# Patient Record
Sex: Male | Born: 1950 | Race: White | Hispanic: No | Marital: Married | Smoking: Never smoker
Health system: Southern US, Community
[De-identification: ages and names within clinical notes are randomized; demographics above are authoritative.]

---

## 1998-01-20 ENCOUNTER — Ambulatory Visit (HOSPITAL_COMMUNITY): Admission: RE | Admit: 1998-01-20 | Discharge: 1998-01-20 | Payer: Self-pay | Admitting: Gastroenterology

## 2004-11-07 ENCOUNTER — Encounter: Admission: RE | Admit: 2004-11-07 | Discharge: 2004-11-07 | Payer: Self-pay | Admitting: Family Medicine

## 2005-04-03 ENCOUNTER — Ambulatory Visit (HOSPITAL_COMMUNITY): Admission: RE | Admit: 2005-04-03 | Discharge: 2005-04-03 | Payer: Self-pay | Admitting: Gastroenterology

## 2010-04-17 ENCOUNTER — Other Ambulatory Visit: Payer: Self-pay | Admitting: Orthopedic Surgery

## 2010-04-17 DIAGNOSIS — M545 Low back pain, unspecified: Secondary | ICD-10-CM

## 2010-04-25 ENCOUNTER — Ambulatory Visit
Admission: RE | Admit: 2010-04-25 | Discharge: 2010-04-25 | Disposition: A | Discharge status: Home / Self Care | Payer: BC Managed Care – PPO | Source: Ambulatory Visit | Attending: Orthopedic Surgery | Admitting: Orthopedic Surgery

## 2010-04-25 DIAGNOSIS — M545 Low back pain: Secondary | ICD-10-CM

## 2014-01-19 DIAGNOSIS — Z23 Encounter for immunization: Secondary | ICD-10-CM | POA: Diagnosis not present

## 2014-06-24 DIAGNOSIS — Z6825 Body mass index (BMI) 25.0-25.9, adult: Secondary | ICD-10-CM | POA: Diagnosis not present

## 2014-06-24 DIAGNOSIS — Z0000 Encounter for general adult medical examination without abnormal findings: Secondary | ICD-10-CM | POA: Diagnosis not present

## 2014-06-24 DIAGNOSIS — Z125 Encounter for screening for malignant neoplasm of prostate: Secondary | ICD-10-CM | POA: Diagnosis not present

## 2014-06-24 DIAGNOSIS — Z23 Encounter for immunization: Secondary | ICD-10-CM | POA: Diagnosis not present

## 2014-07-21 DIAGNOSIS — M25511 Pain in right shoulder: Secondary | ICD-10-CM | POA: Diagnosis not present

## 2014-08-04 DIAGNOSIS — M25511 Pain in right shoulder: Secondary | ICD-10-CM | POA: Diagnosis not present

## 2014-08-23 DIAGNOSIS — B079 Viral wart, unspecified: Secondary | ICD-10-CM | POA: Diagnosis not present

## 2014-08-23 DIAGNOSIS — L578 Other skin changes due to chronic exposure to nonionizing radiation: Secondary | ICD-10-CM | POA: Diagnosis not present

## 2014-08-24 DIAGNOSIS — M25511 Pain in right shoulder: Secondary | ICD-10-CM | POA: Diagnosis not present

## 2014-08-29 DIAGNOSIS — M25511 Pain in right shoulder: Secondary | ICD-10-CM | POA: Diagnosis not present

## 2014-08-31 DIAGNOSIS — H43813 Vitreous degeneration, bilateral: Secondary | ICD-10-CM | POA: Diagnosis not present

## 2014-08-31 DIAGNOSIS — H52223 Regular astigmatism, bilateral: Secondary | ICD-10-CM | POA: Diagnosis not present

## 2014-08-31 DIAGNOSIS — H43393 Other vitreous opacities, bilateral: Secondary | ICD-10-CM | POA: Diagnosis not present

## 2014-08-31 DIAGNOSIS — H5203 Hypermetropia, bilateral: Secondary | ICD-10-CM | POA: Diagnosis not present

## 2014-08-31 DIAGNOSIS — H524 Presbyopia: Secondary | ICD-10-CM | POA: Diagnosis not present

## 2014-12-09 DIAGNOSIS — Z23 Encounter for immunization: Secondary | ICD-10-CM | POA: Diagnosis not present

## 2015-08-29 DIAGNOSIS — L739 Follicular disorder, unspecified: Secondary | ICD-10-CM | POA: Diagnosis not present

## 2015-08-29 DIAGNOSIS — L578 Other skin changes due to chronic exposure to nonionizing radiation: Secondary | ICD-10-CM | POA: Diagnosis not present

## 2015-08-29 DIAGNOSIS — L570 Actinic keratosis: Secondary | ICD-10-CM | POA: Diagnosis not present

## 2015-08-29 DIAGNOSIS — L720 Epidermal cyst: Secondary | ICD-10-CM | POA: Diagnosis not present

## 2015-09-27 DIAGNOSIS — E663 Overweight: Secondary | ICD-10-CM | POA: Diagnosis not present

## 2015-09-27 DIAGNOSIS — Z1389 Encounter for screening for other disorder: Secondary | ICD-10-CM | POA: Diagnosis not present

## 2015-09-27 DIAGNOSIS — Z125 Encounter for screening for malignant neoplasm of prostate: Secondary | ICD-10-CM | POA: Diagnosis not present

## 2015-09-27 DIAGNOSIS — Z9181 History of falling: Secondary | ICD-10-CM | POA: Diagnosis not present

## 2015-09-27 DIAGNOSIS — E7800 Pure hypercholesterolemia, unspecified: Secondary | ICD-10-CM | POA: Diagnosis not present

## 2015-09-27 DIAGNOSIS — Z6826 Body mass index (BMI) 26.0-26.9, adult: Secondary | ICD-10-CM | POA: Diagnosis not present

## 2015-09-27 DIAGNOSIS — Z23 Encounter for immunization: Secondary | ICD-10-CM | POA: Diagnosis not present

## 2015-09-27 DIAGNOSIS — Z0000 Encounter for general adult medical examination without abnormal findings: Secondary | ICD-10-CM | POA: Diagnosis not present

## 2015-10-10 DIAGNOSIS — Z125 Encounter for screening for malignant neoplasm of prostate: Secondary | ICD-10-CM | POA: Diagnosis not present

## 2015-10-10 DIAGNOSIS — E7800 Pure hypercholesterolemia, unspecified: Secondary | ICD-10-CM | POA: Diagnosis not present

## 2015-10-10 DIAGNOSIS — E559 Vitamin D deficiency, unspecified: Secondary | ICD-10-CM | POA: Diagnosis not present

## 2015-10-10 DIAGNOSIS — M545 Low back pain: Secondary | ICD-10-CM | POA: Diagnosis not present

## 2016-01-02 DIAGNOSIS — Z23 Encounter for immunization: Secondary | ICD-10-CM | POA: Diagnosis not present

## 2016-07-25 DIAGNOSIS — L821 Other seborrheic keratosis: Secondary | ICD-10-CM | POA: Diagnosis not present

## 2016-07-25 DIAGNOSIS — L578 Other skin changes due to chronic exposure to nonionizing radiation: Secondary | ICD-10-CM | POA: Diagnosis not present

## 2016-07-25 DIAGNOSIS — L720 Epidermal cyst: Secondary | ICD-10-CM | POA: Diagnosis not present

## 2016-08-12 DIAGNOSIS — M79672 Pain in left foot: Secondary | ICD-10-CM | POA: Diagnosis not present

## 2016-08-12 DIAGNOSIS — M2550 Pain in unspecified joint: Secondary | ICD-10-CM | POA: Diagnosis not present

## 2016-08-13 DIAGNOSIS — M2550 Pain in unspecified joint: Secondary | ICD-10-CM | POA: Diagnosis not present

## 2016-08-27 DIAGNOSIS — E559 Vitamin D deficiency, unspecified: Secondary | ICD-10-CM | POA: Diagnosis not present

## 2016-08-27 DIAGNOSIS — Z125 Encounter for screening for malignant neoplasm of prostate: Secondary | ICD-10-CM | POA: Diagnosis not present

## 2016-08-27 DIAGNOSIS — E7800 Pure hypercholesterolemia, unspecified: Secondary | ICD-10-CM | POA: Diagnosis not present

## 2016-08-28 DIAGNOSIS — M2550 Pain in unspecified joint: Secondary | ICD-10-CM | POA: Diagnosis not present

## 2016-08-28 DIAGNOSIS — Z125 Encounter for screening for malignant neoplasm of prostate: Secondary | ICD-10-CM | POA: Diagnosis not present

## 2016-08-28 DIAGNOSIS — Z23 Encounter for immunization: Secondary | ICD-10-CM | POA: Diagnosis not present

## 2016-08-28 DIAGNOSIS — Z6826 Body mass index (BMI) 26.0-26.9, adult: Secondary | ICD-10-CM | POA: Diagnosis not present

## 2016-08-28 DIAGNOSIS — M79672 Pain in left foot: Secondary | ICD-10-CM | POA: Diagnosis not present

## 2016-08-28 DIAGNOSIS — E559 Vitamin D deficiency, unspecified: Secondary | ICD-10-CM | POA: Diagnosis not present

## 2016-08-28 DIAGNOSIS — E7800 Pure hypercholesterolemia, unspecified: Secondary | ICD-10-CM | POA: Diagnosis not present

## 2016-10-01 DIAGNOSIS — Z9181 History of falling: Secondary | ICD-10-CM | POA: Diagnosis not present

## 2016-10-01 DIAGNOSIS — Z136 Encounter for screening for cardiovascular disorders: Secondary | ICD-10-CM | POA: Diagnosis not present

## 2016-10-01 DIAGNOSIS — E785 Hyperlipidemia, unspecified: Secondary | ICD-10-CM | POA: Diagnosis not present

## 2016-10-01 DIAGNOSIS — Z1389 Encounter for screening for other disorder: Secondary | ICD-10-CM | POA: Diagnosis not present

## 2016-10-01 DIAGNOSIS — M79672 Pain in left foot: Secondary | ICD-10-CM | POA: Diagnosis not present

## 2016-10-01 DIAGNOSIS — Z125 Encounter for screening for malignant neoplasm of prostate: Secondary | ICD-10-CM | POA: Diagnosis not present

## 2016-10-01 DIAGNOSIS — Z0000 Encounter for general adult medical examination without abnormal findings: Secondary | ICD-10-CM | POA: Diagnosis not present

## 2016-10-15 ENCOUNTER — Ambulatory Visit (INDEPENDENT_AMBULATORY_CARE_PROVIDER_SITE_OTHER): Payer: Medicare Other

## 2016-10-15 ENCOUNTER — Encounter: Payer: Self-pay | Admitting: Podiatry

## 2016-10-15 ENCOUNTER — Other Ambulatory Visit: Payer: Self-pay | Admitting: Podiatry

## 2016-10-15 ENCOUNTER — Encounter (INDEPENDENT_AMBULATORY_CARE_PROVIDER_SITE_OTHER): Payer: Self-pay

## 2016-10-15 ENCOUNTER — Ambulatory Visit (INDEPENDENT_AMBULATORY_CARE_PROVIDER_SITE_OTHER): Payer: Medicare Other | Admitting: Podiatry

## 2016-10-15 VITALS — BP 122/67 | HR 58 | Ht 68.0 in | Wt 165.0 lb

## 2016-10-15 DIAGNOSIS — M79671 Pain in right foot: Secondary | ICD-10-CM

## 2016-10-15 DIAGNOSIS — M79672 Pain in left foot: Secondary | ICD-10-CM

## 2016-10-15 DIAGNOSIS — M778 Other enthesopathies, not elsewhere classified: Secondary | ICD-10-CM

## 2016-10-15 DIAGNOSIS — M7742 Metatarsalgia, left foot: Secondary | ICD-10-CM

## 2016-10-15 DIAGNOSIS — M779 Enthesopathy, unspecified: Secondary | ICD-10-CM

## 2016-10-15 DIAGNOSIS — M7752 Other enthesopathy of left foot: Secondary | ICD-10-CM

## 2016-10-15 MED ORDER — MELOXICAM 15 MG PO TABS: 15.0000 mg | ORAL_TABLET | Freq: Every day | ORAL | 0 refills | Status: DC

## 2016-10-15 NOTE — Progress Notes (Signed)
   Subjective:    Patient ID: Bryan Barnes, male    DOB: 1950-09-17, 66 y.o.   MRN: [id number] Chief Complaint  Patient presents with  . Foot Pain    left foot pain ball between 2nd & 3rd toes burning sensation     HPI   66 y.o. male presents with the above complaint. Reports burning sensation in the ball of left foot between 2nd & 3rd toes.  Started in may. Denies change in activity, known injury.  No past medical history on file. No past surgical history on file.  Current Outpatient Prescriptions:  .  meloxicam (MOBIC) 15 MG tablet, Take 1 tablet (15 mg total) by mouth daily., Disp: 30 tablet, Rfl: 0  Allergies  Allergen Reactions  . Penicillins Swelling and Rash     Review of Systems  HENT: Positive for hearing loss.   Musculoskeletal: Positive for arthralgias and gait problem.  All other systems reviewed and are negative.      Objective:   Physical Exam  Vitals:   10/15/16 1114  BP: 122/67  Pulse: (!) 58   General AA&O x3. Normal mood and affect.  Vascular Dorsalis pedis and posterior tibial pulses  present 2+ bilaterally  Capillary refill normal to all digits. Pedal hair growth normal.  Neurologic Epicritic sensation grossly present.  Dermatologic No open lesions. Interspaces clear of maceration. Nails well groomed and normal in appearance.  Orthopedic: MMT 5/5 in dorsiflexion, plantarflexion, inversion, and eversion. Normal joint ROM without pain or crepitus. Pain on palpation L 2nd/3rd metatarsal heads plantarly. Reduced 1st MPJ ROM L with slight crepitus but without pain on ROM.   Radiographs: 3 views L foot. No fractures or dislocations. 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 Elongated 2nd/3rd metatarsals noted.     Assessment & Plan:  Patient was evaluated and treated and all questions answered.  Capuslitis L 2nd/3rd Metatarsals -XR reviewed as above. Pain likely 2/2 abnormal parabola -eRx Meloxicam for pain and inflammation -Metatarsal pads dispensed.

## 2016-10-24 ENCOUNTER — Ambulatory Visit (INDEPENDENT_AMBULATORY_CARE_PROVIDER_SITE_OTHER): Payer: Medicare Other | Admitting: Sports Medicine

## 2016-10-24 ENCOUNTER — Other Ambulatory Visit: Payer: Medicare Other

## 2016-10-24 DIAGNOSIS — M7752 Other enthesopathy of left foot: Secondary | ICD-10-CM

## 2016-10-24 DIAGNOSIS — M79671 Pain in right foot: Secondary | ICD-10-CM

## 2016-10-24 DIAGNOSIS — M7742 Metatarsalgia, left foot: Secondary | ICD-10-CM

## 2016-10-24 DIAGNOSIS — M779 Enthesopathy, unspecified: Secondary | ICD-10-CM

## 2016-10-24 DIAGNOSIS — M778 Other enthesopathies, not elsewhere classified: Secondary | ICD-10-CM

## 2016-10-24 NOTE — Progress Notes (Signed)
Patient was seen and evaluated by [facility]. Patient was casted today for custom functional foot orthotics. Patient is a Patient of Dr. March Rummage and ABN/Cash pay collected. Dr. March Rummage to complete orthotic orders. Patient to return for pickup when called or when scheduled. -Dr. Cannon Kettle

## 2016-10-24 NOTE — Progress Notes (Signed)
Patient ID: Bryan Barnes, male   DOB: [birth year], 66 y.o.   MRN: [id number]   Patient presents at the request of Dr March Rummage to be casted for custom orthotics with [facility] Certified Pedorthist.  Patient will return in 4 weeks to be fitted.

## 2016-10-29 ENCOUNTER — Other Ambulatory Visit: Payer: Self-pay | Admitting: Podiatry

## 2016-10-29 DIAGNOSIS — M779 Enthesopathy, unspecified: Secondary | ICD-10-CM

## 2016-11-18 ENCOUNTER — Telehealth: Payer: Self-pay | Admitting: Podiatry

## 2016-11-18 NOTE — Telephone Encounter (Signed)
Pt called to cancel his appt with [facility] for 10.18.18 but was asking if he could pick them up tomorrow. I am not sure if they are in could someone please check and call pt and let him know.

## 2016-11-25 ENCOUNTER — Encounter: Payer: Self-pay | Admitting: Podiatry

## 2016-11-25 ENCOUNTER — Ambulatory Visit: Payer: Medicare Other | Admitting: Podiatry

## 2016-11-25 DIAGNOSIS — M779 Enthesopathy, unspecified: Principal | ICD-10-CM

## 2016-11-25 DIAGNOSIS — M778 Other enthesopathies, not elsewhere classified: Secondary | ICD-10-CM

## 2016-11-25 NOTE — Progress Notes (Signed)
Patient ID: Bryan Barnes, male   DOB: 30-May-1950, 66 y.o.   MRN: [id number]  Patient presents for orthotic pick up.  Verbal and written break in and wear instructions given.  Patient will follow up in 2 weeks for his scheduled follow up or sooner if symptoms fail to improve.

## 2016-11-25 NOTE — Patient Instructions (Signed)

## 2016-12-05 ENCOUNTER — Other Ambulatory Visit: Payer: Medicare Other | Admitting: Orthotics

## 2016-12-10 ENCOUNTER — Ambulatory Visit (INDEPENDENT_AMBULATORY_CARE_PROVIDER_SITE_OTHER): Payer: Medicare Other | Admitting: Podiatry

## 2016-12-10 ENCOUNTER — Ambulatory Visit: Payer: Medicare Other | Admitting: Podiatry

## 2016-12-10 DIAGNOSIS — M7752 Other enthesopathy of left foot: Secondary | ICD-10-CM

## 2016-12-10 DIAGNOSIS — M7742 Metatarsalgia, left foot: Secondary | ICD-10-CM | POA: Diagnosis not present

## 2016-12-10 DIAGNOSIS — M779 Enthesopathy, unspecified: Principal | ICD-10-CM

## 2016-12-10 DIAGNOSIS — M778 Other enthesopathies, not elsewhere classified: Secondary | ICD-10-CM

## 2016-12-17 DIAGNOSIS — Z23 Encounter for immunization: Secondary | ICD-10-CM | POA: Diagnosis not present

## 2016-12-19 ENCOUNTER — Ambulatory Visit: Payer: Medicare Other | Admitting: *Deleted

## 2016-12-19 DIAGNOSIS — M7742 Metatarsalgia, left foot: Secondary | ICD-10-CM

## 2016-12-20 NOTE — Progress Notes (Signed)
Patient ID: Bryan Barnes, male   DOB: December 30, 1950, 66 y.o.   MRN: [id number]   Patient presents at the request of Dr March Rummage to see [facility] certified pedorthist for adjustments to current orthotics.  After exam and discussion orthotics will be returned to the manufacturer to add bilateral met bar and change top cover to [employer].  Patient in agreement with discussion and will be called for fitting when they return.

## 2016-12-29 NOTE — Progress Notes (Signed)
  Subjective:  Patient ID: Garan Frappier, male    DOB: 03/02/1950,  MRN: [id number]  Chief Complaint  Patient presents with  . Foot Pain    Orthotics seem to be helping, he is able to walk longer before feet start hurting.   66 y.o. male returns for the above complaint.  States the orthotics seem to be helping.  He is able to walk longer than before before his feet start hurting.  Objective:  There were no vitals filed for this visit. General AA&O x3. Normal mood and affect.  Vascular Pedal pulses palpable.  Neurologic Epicritic sensation grossly intact.  Dermatologic No open lesions. Skin normal texture and turgor.  Orthopedic: No pain to palpation either foot.   Assessment & Plan:  Patient was evaluated and treated and all questions answered.  Capsulitis 2nd/3rd Metatarsals -Orthotics seem to be helping continue. -Second third metatarsals not adequately offloaded will arrange follow-up with orthotist for recheck

## 2017-01-01 DIAGNOSIS — H2513 Age-related nuclear cataract, bilateral: Secondary | ICD-10-CM | POA: Diagnosis not present

## 2017-01-07 ENCOUNTER — Telehealth: Payer: Self-pay | Admitting: Podiatry

## 2017-01-07 NOTE — Telephone Encounter (Signed)
Per Voicemail from pt he was checking to see if his orthotics are back yet. Please call pt and advise.

## 2017-01-16 ENCOUNTER — Telehealth: Payer: Self-pay | Admitting: Podiatry

## 2017-01-16 NOTE — Telephone Encounter (Signed)
I am waiting on some orthotics. I've been waiting a couple of weeks and I have left several messages and no one has returned my call. I am very eager to get these to help with my foot pain. I think I've been lost in the phone tree. If someone would please give me a call as I feel they are ready to be picked up. You can reach me at [phone].

## 2017-07-25 DIAGNOSIS — L570 Actinic keratosis: Secondary | ICD-10-CM | POA: Diagnosis not present

## 2017-07-25 DIAGNOSIS — C44519 Basal cell carcinoma of skin of other part of trunk: Secondary | ICD-10-CM | POA: Diagnosis not present

## 2017-08-19 DIAGNOSIS — M7741 Metatarsalgia, right foot: Secondary | ICD-10-CM | POA: Diagnosis not present

## 2017-08-19 DIAGNOSIS — Z1331 Encounter for screening for depression: Secondary | ICD-10-CM | POA: Diagnosis not present

## 2017-08-19 DIAGNOSIS — Z125 Encounter for screening for malignant neoplasm of prostate: Secondary | ICD-10-CM | POA: Diagnosis not present

## 2017-08-19 DIAGNOSIS — Z1339 Encounter for screening examination for other mental health and behavioral disorders: Secondary | ICD-10-CM | POA: Diagnosis not present

## 2017-08-19 DIAGNOSIS — E7800 Pure hypercholesterolemia, unspecified: Secondary | ICD-10-CM | POA: Diagnosis not present

## 2017-08-19 DIAGNOSIS — E559 Vitamin D deficiency, unspecified: Secondary | ICD-10-CM | POA: Diagnosis not present

## 2017-08-19 DIAGNOSIS — M2550 Pain in unspecified joint: Secondary | ICD-10-CM | POA: Diagnosis not present

## 2017-09-29 DIAGNOSIS — Z800 Family history of malignant neoplasm of digestive organs: Secondary | ICD-10-CM | POA: Diagnosis not present

## 2017-09-29 DIAGNOSIS — K640 First degree hemorrhoids: Secondary | ICD-10-CM | POA: Diagnosis not present

## 2017-10-02 DIAGNOSIS — Z9181 History of falling: Secondary | ICD-10-CM | POA: Diagnosis not present

## 2017-10-02 DIAGNOSIS — E785 Hyperlipidemia, unspecified: Secondary | ICD-10-CM | POA: Diagnosis not present

## 2017-10-02 DIAGNOSIS — Z136 Encounter for screening for cardiovascular disorders: Secondary | ICD-10-CM | POA: Diagnosis not present

## 2017-10-02 DIAGNOSIS — Z0000 Encounter for general adult medical examination without abnormal findings: Secondary | ICD-10-CM | POA: Diagnosis not present

## 2017-10-02 DIAGNOSIS — Z139 Encounter for screening, unspecified: Secondary | ICD-10-CM | POA: Diagnosis not present

## 2017-10-02 DIAGNOSIS — Z125 Encounter for screening for malignant neoplasm of prostate: Secondary | ICD-10-CM | POA: Diagnosis not present

## 2017-11-26 DIAGNOSIS — Z23 Encounter for immunization: Secondary | ICD-10-CM | POA: Diagnosis not present

## 2017-12-10 DIAGNOSIS — H2513 Age-related nuclear cataract, bilateral: Secondary | ICD-10-CM | POA: Diagnosis not present

## 2018-02-10 DIAGNOSIS — L720 Epidermal cyst: Secondary | ICD-10-CM | POA: Diagnosis not present

## 2018-02-10 DIAGNOSIS — L578 Other skin changes due to chronic exposure to nonionizing radiation: Secondary | ICD-10-CM | POA: Diagnosis not present

## 2018-02-10 DIAGNOSIS — L821 Other seborrheic keratosis: Secondary | ICD-10-CM | POA: Diagnosis not present

## 2018-02-10 DIAGNOSIS — L820 Inflamed seborrheic keratosis: Secondary | ICD-10-CM | POA: Diagnosis not present

## 2018-07-27 DIAGNOSIS — L821 Other seborrheic keratosis: Secondary | ICD-10-CM | POA: Diagnosis not present

## 2018-07-27 DIAGNOSIS — L578 Other skin changes due to chronic exposure to nonionizing radiation: Secondary | ICD-10-CM | POA: Diagnosis not present

## 2018-07-27 DIAGNOSIS — L720 Epidermal cyst: Secondary | ICD-10-CM | POA: Diagnosis not present

## 2018-08-24 DIAGNOSIS — E7800 Pure hypercholesterolemia, unspecified: Secondary | ICD-10-CM | POA: Diagnosis not present

## 2018-08-24 DIAGNOSIS — E559 Vitamin D deficiency, unspecified: Secondary | ICD-10-CM | POA: Diagnosis not present

## 2018-08-24 DIAGNOSIS — Z1331 Encounter for screening for depression: Secondary | ICD-10-CM | POA: Diagnosis not present

## 2018-08-24 DIAGNOSIS — M7742 Metatarsalgia, left foot: Secondary | ICD-10-CM | POA: Diagnosis not present

## 2018-08-24 DIAGNOSIS — Z125 Encounter for screening for malignant neoplasm of prostate: Secondary | ICD-10-CM | POA: Diagnosis not present

## 2018-08-24 DIAGNOSIS — M7741 Metatarsalgia, right foot: Secondary | ICD-10-CM | POA: Diagnosis not present

## 2018-08-24 DIAGNOSIS — M2550 Pain in unspecified joint: Secondary | ICD-10-CM | POA: Diagnosis not present

## 2018-10-05 DIAGNOSIS — Z0000 Encounter for general adult medical examination without abnormal findings: Secondary | ICD-10-CM | POA: Diagnosis not present

## 2018-10-05 DIAGNOSIS — Z9181 History of falling: Secondary | ICD-10-CM | POA: Diagnosis not present

## 2018-10-05 DIAGNOSIS — E785 Hyperlipidemia, unspecified: Secondary | ICD-10-CM | POA: Diagnosis not present

## 2018-10-05 DIAGNOSIS — Z125 Encounter for screening for malignant neoplasm of prostate: Secondary | ICD-10-CM | POA: Diagnosis not present

## 2018-10-05 DIAGNOSIS — Z1331 Encounter for screening for depression: Secondary | ICD-10-CM | POA: Diagnosis not present

## 2018-11-11 DIAGNOSIS — Z23 Encounter for immunization: Secondary | ICD-10-CM | POA: Diagnosis not present

## 2019-01-04 ENCOUNTER — Other Ambulatory Visit: Payer: Self-pay

## 2019-01-04 ENCOUNTER — Other Ambulatory Visit: Payer: Self-pay | Admitting: Family Medicine

## 2019-01-04 ENCOUNTER — Ambulatory Visit
Admission: RE | Admit: 2019-01-04 | Discharge: 2019-01-04 | Disposition: A | Discharge status: Home / Self Care | Payer: Medicare Other | Source: Ambulatory Visit | Attending: Family Medicine | Admitting: Family Medicine

## 2019-01-04 DIAGNOSIS — M898X6 Other specified disorders of bone, lower leg: Secondary | ICD-10-CM | POA: Diagnosis not present

## 2019-01-04 DIAGNOSIS — M25549 Pain in joints of unspecified hand: Secondary | ICD-10-CM | POA: Diagnosis not present

## 2019-01-04 DIAGNOSIS — Z6825 Body mass index (BMI) 25.0-25.9, adult: Secondary | ICD-10-CM | POA: Diagnosis not present

## 2019-01-04 DIAGNOSIS — M79661 Pain in right lower leg: Secondary | ICD-10-CM | POA: Diagnosis not present

## 2019-01-23 DIAGNOSIS — H2513 Age-related nuclear cataract, bilateral: Secondary | ICD-10-CM | POA: Diagnosis not present

## 2019-03-20 ENCOUNTER — Ambulatory Visit: Payer: Medicare Other | Attending: Internal Medicine

## 2019-03-20 DIAGNOSIS — Z23 Encounter for immunization: Secondary | ICD-10-CM | POA: Insufficient documentation

## 2019-03-20 NOTE — Progress Notes (Signed)
   Covid-19 Vaccination Clinic  Name:  Aksh Derda    MRN: [id number] DOB: 1950-07-06  03/20/2019  Mr. Drum was observed post Covid-19 immunization for 15 minutes without incidence. He was provided with Vaccine Information Sheet and instruction to access the V-Safe system.   Mr. Upadhyay was instructed to call 911 with any severe reactions post vaccine: Marland Kitchen Difficulty breathing  . Swelling of your face and throat  . A fast heartbeat  . A bad rash all over your body  . Dizziness and weakness    Immunizations Administered    Name Date Dose VIS Date Route   Pfizer COVID-19 Vaccine 03/20/2019  9:10 AM 0.3 mL 01/15/2019 Intramuscular   Manufacturer: [place]   Lot: [id number]   [place]: [id number]

## 2019-04-11 ENCOUNTER — Ambulatory Visit: Payer: Medicare Other | Attending: Internal Medicine

## 2019-04-11 DIAGNOSIS — Z23 Encounter for immunization: Secondary | ICD-10-CM | POA: Insufficient documentation

## 2019-04-11 NOTE — Progress Notes (Signed)
   Covid-19 Vaccination Clinic  Name:  Dyer Huizar    MRN: [id number] DOB: 11/08/1950  04/11/2019  Mr. Lubin was observed post Covid-19 immunization for 15 minutes without incident. He was provided with Vaccine Information Sheet and instruction to access the V-Safe system.   Mr. Perret was instructed to call 911 with any severe reactions post vaccine: Marland Kitchen Difficulty breathing  . Swelling of face and throat  . A fast heartbeat  . A bad rash all over body  . Dizziness and weakness   Immunizations Administered    Name Date Dose VIS Date Route   Pfizer COVID-19 Vaccine 04/11/2019 12:33 PM 0.3 mL 01/15/2019 Intramuscular   Manufacturer: [place]   Lot: [id number]   [place]: [id number]

## 2019-08-17 DIAGNOSIS — N390 Urinary tract infection, site not specified: Secondary | ICD-10-CM | POA: Diagnosis not present

## 2019-08-26 DIAGNOSIS — N410 Acute prostatitis: Secondary | ICD-10-CM | POA: Diagnosis not present

## 2019-08-26 DIAGNOSIS — Z6825 Body mass index (BMI) 25.0-25.9, adult: Secondary | ICD-10-CM | POA: Diagnosis not present

## 2019-08-26 DIAGNOSIS — N5089 Other specified disorders of the male genital organs: Secondary | ICD-10-CM | POA: Diagnosis not present

## 2019-09-27 DIAGNOSIS — L219 Seborrheic dermatitis, unspecified: Secondary | ICD-10-CM | POA: Diagnosis not present

## 2019-09-27 DIAGNOSIS — L578 Other skin changes due to chronic exposure to nonionizing radiation: Secondary | ICD-10-CM | POA: Diagnosis not present

## 2019-09-27 DIAGNOSIS — L821 Other seborrheic keratosis: Secondary | ICD-10-CM | POA: Diagnosis not present

## 2019-09-27 DIAGNOSIS — L570 Actinic keratosis: Secondary | ICD-10-CM | POA: Diagnosis not present

## 2019-11-03 DIAGNOSIS — Z23 Encounter for immunization: Secondary | ICD-10-CM | POA: Diagnosis not present

## 2020-02-19 DIAGNOSIS — Z1152 Encounter for screening for COVID-19: Secondary | ICD-10-CM | POA: Diagnosis not present

## 2020-03-31 DIAGNOSIS — M545 Low back pain, unspecified: Secondary | ICD-10-CM | POA: Diagnosis not present

## 2020-03-31 DIAGNOSIS — Z6826 Body mass index (BMI) 26.0-26.9, adult: Secondary | ICD-10-CM | POA: Diagnosis not present

## 2020-06-20 DIAGNOSIS — Z23 Encounter for immunization: Secondary | ICD-10-CM | POA: Diagnosis not present

## 2020-07-20 DIAGNOSIS — L570 Actinic keratosis: Secondary | ICD-10-CM | POA: Diagnosis not present

## 2020-07-20 DIAGNOSIS — L821 Other seborrheic keratosis: Secondary | ICD-10-CM | POA: Diagnosis not present

## 2020-07-20 DIAGNOSIS — L578 Other skin changes due to chronic exposure to nonionizing radiation: Secondary | ICD-10-CM | POA: Diagnosis not present

## 2020-10-20 DIAGNOSIS — Z23 Encounter for immunization: Secondary | ICD-10-CM | POA: Diagnosis not present

## 2020-11-22 DIAGNOSIS — E7800 Pure hypercholesterolemia, unspecified: Secondary | ICD-10-CM | POA: Diagnosis not present

## 2020-11-22 DIAGNOSIS — E559 Vitamin D deficiency, unspecified: Secondary | ICD-10-CM | POA: Diagnosis not present

## 2020-11-22 DIAGNOSIS — Z125 Encounter for screening for malignant neoplasm of prostate: Secondary | ICD-10-CM | POA: Diagnosis not present

## 2020-11-23 DIAGNOSIS — Z0000 Encounter for general adult medical examination without abnormal findings: Secondary | ICD-10-CM | POA: Diagnosis not present

## 2020-11-23 DIAGNOSIS — Z9181 History of falling: Secondary | ICD-10-CM | POA: Diagnosis not present

## 2020-11-23 DIAGNOSIS — Z1331 Encounter for screening for depression: Secondary | ICD-10-CM | POA: Diagnosis not present

## 2020-11-23 DIAGNOSIS — E785 Hyperlipidemia, unspecified: Secondary | ICD-10-CM | POA: Diagnosis not present

## 2020-12-01 DIAGNOSIS — M19042 Primary osteoarthritis, left hand: Secondary | ICD-10-CM | POA: Diagnosis not present

## 2020-12-01 DIAGNOSIS — Z6826 Body mass index (BMI) 26.0-26.9, adult: Secondary | ICD-10-CM | POA: Diagnosis not present

## 2020-12-01 DIAGNOSIS — E559 Vitamin D deficiency, unspecified: Secondary | ICD-10-CM | POA: Diagnosis not present

## 2020-12-01 DIAGNOSIS — Z139 Encounter for screening, unspecified: Secondary | ICD-10-CM | POA: Diagnosis not present

## 2020-12-01 DIAGNOSIS — E7800 Pure hypercholesterolemia, unspecified: Secondary | ICD-10-CM | POA: Diagnosis not present

## 2020-12-01 DIAGNOSIS — M19041 Primary osteoarthritis, right hand: Secondary | ICD-10-CM | POA: Diagnosis not present

## 2021-01-15 DIAGNOSIS — N390 Urinary tract infection, site not specified: Secondary | ICD-10-CM | POA: Diagnosis not present

## 2021-01-15 DIAGNOSIS — Z6826 Body mass index (BMI) 26.0-26.9, adult: Secondary | ICD-10-CM | POA: Diagnosis not present

## 2021-02-06 DIAGNOSIS — Z20822 Contact with and (suspected) exposure to covid-19: Secondary | ICD-10-CM | POA: Diagnosis not present

## 2021-02-06 DIAGNOSIS — Z03818 Encounter for observation for suspected exposure to other biological agents ruled out: Secondary | ICD-10-CM | POA: Diagnosis not present

## 2021-07-24 DIAGNOSIS — L570 Actinic keratosis: Secondary | ICD-10-CM | POA: Diagnosis not present

## 2021-07-24 DIAGNOSIS — D485 Neoplasm of uncertain behavior of skin: Secondary | ICD-10-CM | POA: Diagnosis not present

## 2021-07-24 DIAGNOSIS — L578 Other skin changes due to chronic exposure to nonionizing radiation: Secondary | ICD-10-CM | POA: Diagnosis not present

## 2021-08-29 IMAGING — CR DG TIBIA/FIBULA 2V*R*
1 series · 1 of 1 positions shown · non-contrast
Comparison: None.

CLINICAL DATA: Pain

EXAM:
RIGHT TIBIA AND FIBULA - 2 VIEW

[x tib-fib ap right]
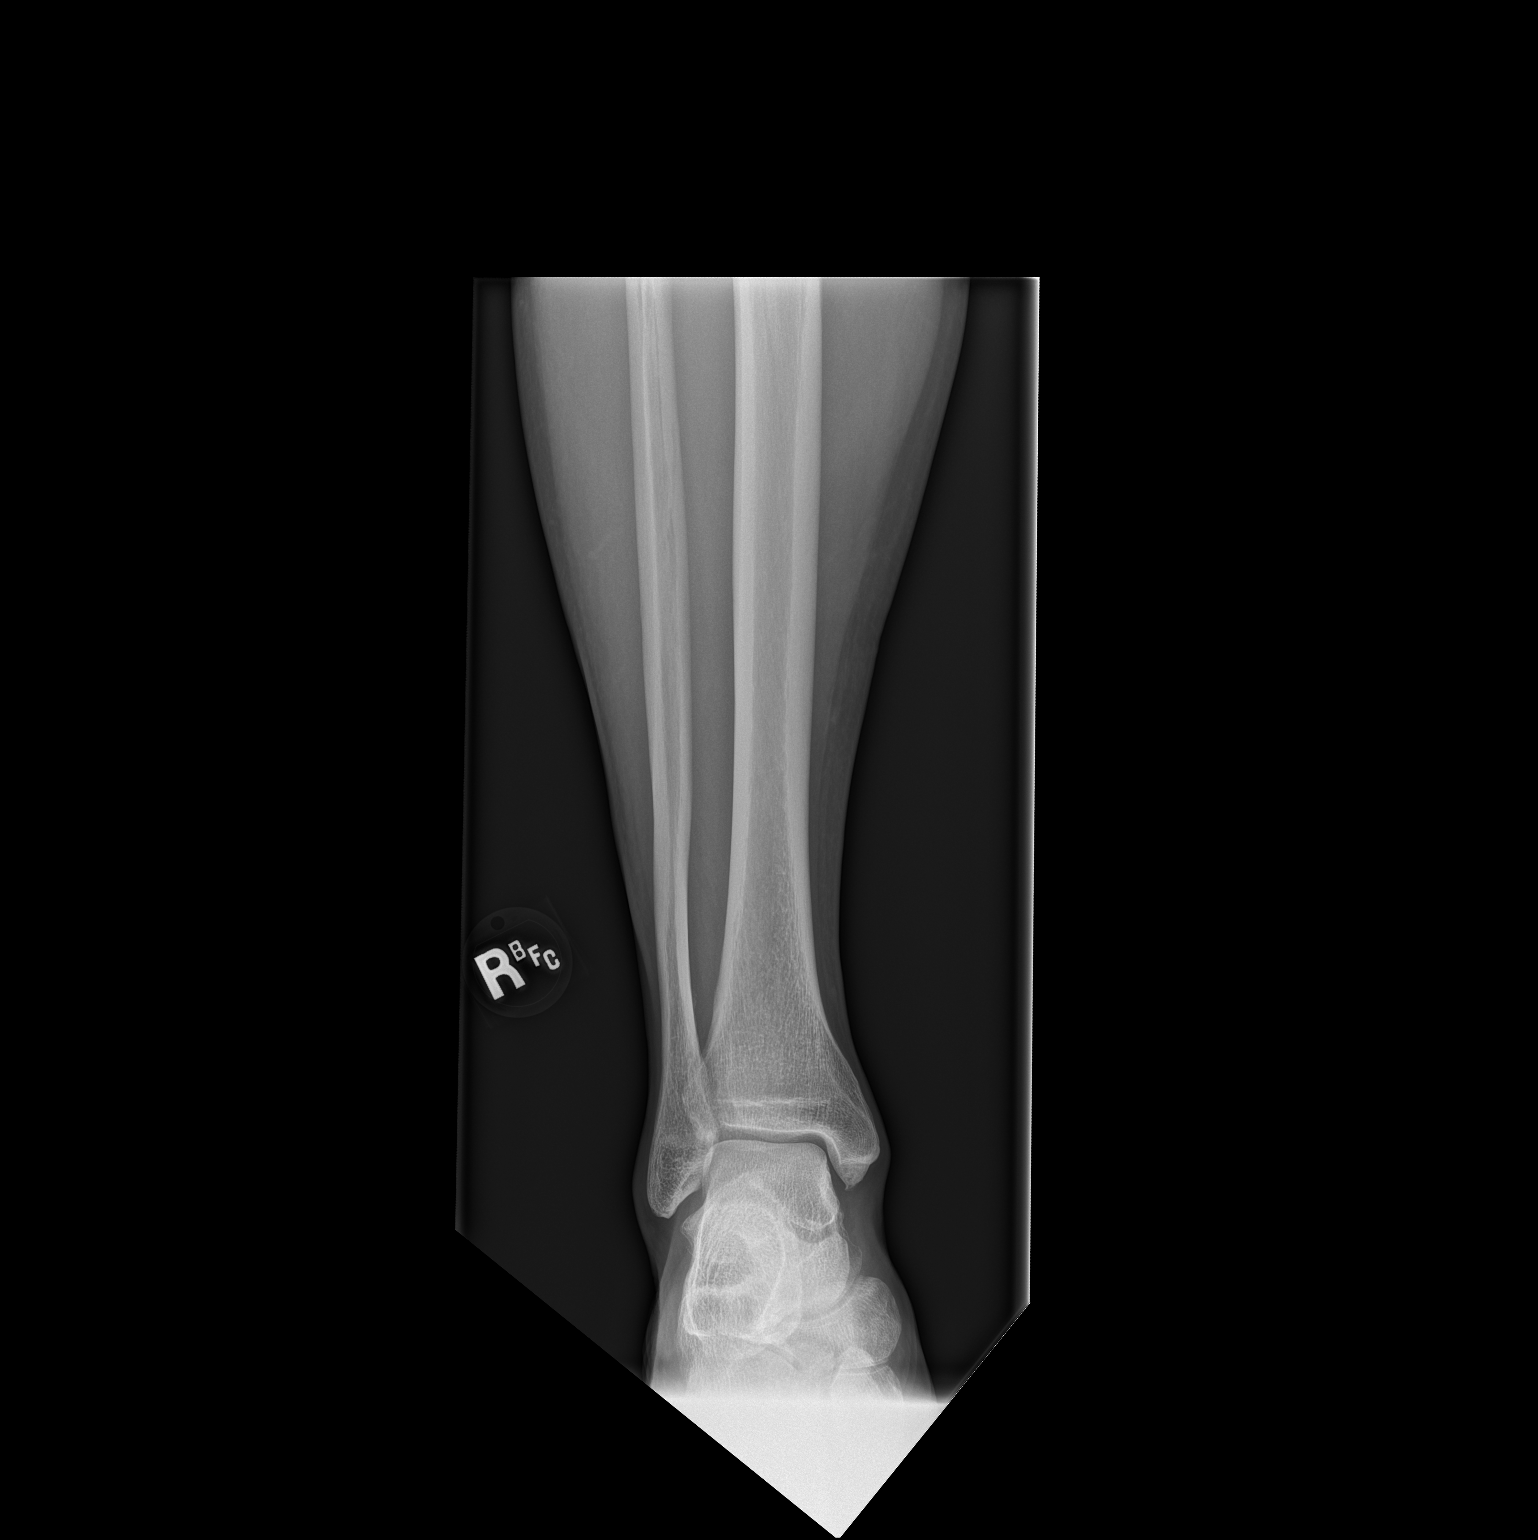

[1 of 1 positions shown; findings below may reference images not displayed]

FINDINGS: There is no evidence of fracture or other focal bone lesions. Soft
tissues are unremarkable.
IMPRESSION: Negative.

## 2021-11-07 DIAGNOSIS — Z23 Encounter for immunization: Secondary | ICD-10-CM | POA: Diagnosis not present

## 2021-11-26 DIAGNOSIS — Z125 Encounter for screening for malignant neoplasm of prostate: Secondary | ICD-10-CM | POA: Diagnosis not present

## 2021-11-26 DIAGNOSIS — E7800 Pure hypercholesterolemia, unspecified: Secondary | ICD-10-CM | POA: Diagnosis not present

## 2021-11-26 DIAGNOSIS — E559 Vitamin D deficiency, unspecified: Secondary | ICD-10-CM | POA: Diagnosis not present

## 2021-12-03 DIAGNOSIS — E7800 Pure hypercholesterolemia, unspecified: Secondary | ICD-10-CM | POA: Diagnosis not present

## 2021-12-03 DIAGNOSIS — E559 Vitamin D deficiency, unspecified: Secondary | ICD-10-CM | POA: Diagnosis not present

## 2021-12-03 DIAGNOSIS — M19042 Primary osteoarthritis, left hand: Secondary | ICD-10-CM | POA: Diagnosis not present

## 2021-12-03 DIAGNOSIS — Z9181 History of falling: Secondary | ICD-10-CM | POA: Diagnosis not present

## 2021-12-03 DIAGNOSIS — M19041 Primary osteoarthritis, right hand: Secondary | ICD-10-CM | POA: Diagnosis not present

## 2021-12-03 DIAGNOSIS — Z1331 Encounter for screening for depression: Secondary | ICD-10-CM | POA: Diagnosis not present

## 2022-04-25 DIAGNOSIS — Z23 Encounter for immunization: Secondary | ICD-10-CM | POA: Diagnosis not present

## 2022-07-23 DIAGNOSIS — D225 Melanocytic nevi of trunk: Secondary | ICD-10-CM | POA: Diagnosis not present

## 2022-07-23 DIAGNOSIS — L570 Actinic keratosis: Secondary | ICD-10-CM | POA: Diagnosis not present

## 2022-07-23 DIAGNOSIS — L821 Other seborrheic keratosis: Secondary | ICD-10-CM | POA: Diagnosis not present

## 2022-07-23 DIAGNOSIS — L720 Epidermal cyst: Secondary | ICD-10-CM | POA: Diagnosis not present

## 2022-07-23 DIAGNOSIS — L578 Other skin changes due to chronic exposure to nonionizing radiation: Secondary | ICD-10-CM | POA: Diagnosis not present

## 2022-10-22 DIAGNOSIS — Z9181 History of falling: Secondary | ICD-10-CM | POA: Diagnosis not present

## 2022-10-22 DIAGNOSIS — Z139 Encounter for screening, unspecified: Secondary | ICD-10-CM | POA: Diagnosis not present

## 2022-10-22 DIAGNOSIS — Z1211 Encounter for screening for malignant neoplasm of colon: Secondary | ICD-10-CM | POA: Diagnosis not present

## 2022-10-22 DIAGNOSIS — Z1331 Encounter for screening for depression: Secondary | ICD-10-CM | POA: Diagnosis not present

## 2022-10-22 DIAGNOSIS — Z0000 Encounter for general adult medical examination without abnormal findings: Secondary | ICD-10-CM | POA: Diagnosis not present

## 2022-11-22 DIAGNOSIS — Z23 Encounter for immunization: Secondary | ICD-10-CM | POA: Diagnosis not present

## 2022-11-26 DIAGNOSIS — Z131 Encounter for screening for diabetes mellitus: Secondary | ICD-10-CM | POA: Diagnosis not present

## 2022-11-26 DIAGNOSIS — Z125 Encounter for screening for malignant neoplasm of prostate: Secondary | ICD-10-CM | POA: Diagnosis not present

## 2022-11-26 DIAGNOSIS — E559 Vitamin D deficiency, unspecified: Secondary | ICD-10-CM | POA: Diagnosis not present

## 2022-11-26 DIAGNOSIS — E7800 Pure hypercholesterolemia, unspecified: Secondary | ICD-10-CM | POA: Diagnosis not present

## 2022-12-11 DIAGNOSIS — E7800 Pure hypercholesterolemia, unspecified: Secondary | ICD-10-CM | POA: Diagnosis not present

## 2022-12-11 DIAGNOSIS — R7303 Prediabetes: Secondary | ICD-10-CM | POA: Diagnosis not present

## 2022-12-11 DIAGNOSIS — M19042 Primary osteoarthritis, left hand: Secondary | ICD-10-CM | POA: Diagnosis not present

## 2022-12-11 DIAGNOSIS — M19041 Primary osteoarthritis, right hand: Secondary | ICD-10-CM | POA: Diagnosis not present

## 2022-12-11 DIAGNOSIS — E559 Vitamin D deficiency, unspecified: Secondary | ICD-10-CM | POA: Diagnosis not present

## 2023-02-14 ENCOUNTER — Ambulatory Visit (INDEPENDENT_AMBULATORY_CARE_PROVIDER_SITE_OTHER): Payer: Medicare Other | Admitting: Podiatry

## 2023-02-14 ENCOUNTER — Ambulatory Visit (INDEPENDENT_AMBULATORY_CARE_PROVIDER_SITE_OTHER): Payer: Medicare Other

## 2023-02-14 DIAGNOSIS — M792 Neuralgia and neuritis, unspecified: Secondary | ICD-10-CM | POA: Diagnosis not present

## 2023-02-14 DIAGNOSIS — M19072 Primary osteoarthritis, left ankle and foot: Secondary | ICD-10-CM | POA: Diagnosis not present

## 2023-02-14 DIAGNOSIS — M2022 Hallux rigidus, left foot: Secondary | ICD-10-CM

## 2023-02-14 DIAGNOSIS — M778 Other enthesopathies, not elsewhere classified: Secondary | ICD-10-CM

## 2023-02-14 MED ORDER — MELOXICAM 15 MG PO TABS: 15.0000 mg | ORAL_TABLET | Freq: Every day | ORAL | 0 refills | Status: DC

## 2023-02-14 NOTE — Progress Notes (Signed)
       Chief Complaint  Patient presents with   Foot Pain    Orthotics from 2018. Pain in Lt foot started one month ago. No injury, pain is intermittent, over the bridge of the foot and some into the arch. Not sore to touch but hurts to walk.      HPI: 73 y.o. male presents today evaluation of pain in the left midfoot.  He has orthotics from 2018 which are very flexible.  He wears them regularly.  He is not having any pain in the right foot.  He also notes some burning along the medial aspect of the midfoot.  History reviewed. No pertinent past medical history.  History reviewed. No pertinent surgical history.  Allergies  Allergen Reactions   Penicillins Swelling and Rash    Review of Systems  Musculoskeletal:  Positive for joint pain.      Physical Exam: Pain on palpation to the left first metatarsal-medial cuneiform joint.  There is also neuritis noted of the medial dorsal cutaneous nerve/saphenous nerve in this area.  Decreased joint range of motion of first MPJ on the left.  Ankle dorsiflexion is less than 10 degrees with the knee extended on the left.  Epicritic sensation is intact.  Palpable pedal pulses left.  No pain on palpation of the posterior tibial tendon.  Radiographic Exam (left foot, 3 weightbearing views, 02/14/2023):  Normal osseous mineralization.  Significant joint space narrowing at the left first MPJ with some dorsal spurring present at this joint.  No significant arthritic changes are seen at the left first metatarsal-medial cuneiform joint.  No fracture seen  Assessment/Plan of Care: 1. Arthritis of left foot   2. Capsulitis of foot   3. Neuritis   4. Acquired hallux rigidus, left      Meds ordered this encounter  Medications   meloxicam  (MOBIC ) 15 MG tablet    Sig: Take 1 tablet (15 mg total) by mouth daily.    Dispense:  30 tablet    Refill:  0   ORTHOTICS, PODIATRY (AMBULATORY)  Discussed clinical and radiographic findings with patient  today.  Prescription for meloxicam  15 mg for the patient today daily.  Patient refused a cortisone injection today.  He will stop taking the Aleve while he takes the meloxicam .    A temporary pad was placed underneath his orthotic on the left to try to support the midfoot more since his orthotics are too flexible.  He will notify our pedorthist at his orthotic consultation if this was beneficial so that she will know that the arch needs elevated/more support.   Awanda CHARM [place], DPM, FACFAS [facility]     [address] [place], [place] [address]                Office [phone]  Fax [phone]

## 2023-02-16 ENCOUNTER — Encounter: Payer: Self-pay | Admitting: Podiatry

## 2023-02-16 DIAGNOSIS — M19072 Primary osteoarthritis, left ankle and foot: Secondary | ICD-10-CM | POA: Insufficient documentation

## 2023-02-18 DIAGNOSIS — K648 Other hemorrhoids: Secondary | ICD-10-CM | POA: Diagnosis not present

## 2023-02-18 DIAGNOSIS — Z1211 Encounter for screening for malignant neoplasm of colon: Secondary | ICD-10-CM | POA: Diagnosis not present

## 2023-02-18 DIAGNOSIS — Z800 Family history of malignant neoplasm of digestive organs: Secondary | ICD-10-CM | POA: Diagnosis not present

## 2023-02-18 DIAGNOSIS — D123 Benign neoplasm of transverse colon: Secondary | ICD-10-CM | POA: Diagnosis not present

## 2023-02-18 DIAGNOSIS — K5730 Diverticulosis of large intestine without perforation or abscess without bleeding: Secondary | ICD-10-CM | POA: Diagnosis not present

## 2023-03-12 ENCOUNTER — Ambulatory Visit: Payer: Medicare Other

## 2023-03-12 NOTE — Progress Notes (Signed)
 Orthotics   Patient was present and evaluated for Custom molded foot orthotics. Patient will benefit from CFO's to provide total contact to BIL MLA's helping to balance and distribute body weight more evenly across BIL feet helping to reduce plantar pressure and pain. Orthotic will also encourage FF / RF alignment  Patient was scanned today and will return for fitting upon receipt

## 2023-03-15 ENCOUNTER — Other Ambulatory Visit: Payer: Self-pay | Admitting: Podiatry

## 2023-04-08 ENCOUNTER — Ambulatory Visit (INDEPENDENT_AMBULATORY_CARE_PROVIDER_SITE_OTHER): Payer: Medicare Other

## 2023-04-08 DIAGNOSIS — M2142 Flat foot [pes planus] (acquired), left foot: Secondary | ICD-10-CM | POA: Diagnosis not present

## 2023-04-08 DIAGNOSIS — M2141 Flat foot [pes planus] (acquired), right foot: Secondary | ICD-10-CM

## 2023-04-08 DIAGNOSIS — M778 Other enthesopathies, not elsewhere classified: Secondary | ICD-10-CM

## 2023-04-08 DIAGNOSIS — M19072 Primary osteoarthritis, left ankle and foot: Secondary | ICD-10-CM | POA: Diagnosis not present

## 2023-04-08 DIAGNOSIS — M2022 Hallux rigidus, left foot: Secondary | ICD-10-CM

## 2023-04-08 NOTE — Progress Notes (Signed)
 Patient presents today to pick up custom molded foot orthotics, diagnosed with Arthritis BIL Feet by Dr. Carlota Raspberry.   Orthotics were dispensed and fit was satisfactory. Reviewed instructions for break-in and wear. Written instructions given to patient.  Patient will follow up as needed.   Bryan Barnes CPed, CFo, CFm

## 2023-06-18 DIAGNOSIS — C4442 Squamous cell carcinoma of skin of scalp and neck: Secondary | ICD-10-CM | POA: Diagnosis not present

## 2023-06-18 DIAGNOSIS — M19072 Primary osteoarthritis, left ankle and foot: Secondary | ICD-10-CM | POA: Diagnosis not present

## 2023-06-18 DIAGNOSIS — M19041 Primary osteoarthritis, right hand: Secondary | ICD-10-CM | POA: Diagnosis not present

## 2023-06-18 DIAGNOSIS — M19042 Primary osteoarthritis, left hand: Secondary | ICD-10-CM | POA: Diagnosis not present

## 2023-06-18 DIAGNOSIS — M545 Low back pain, unspecified: Secondary | ICD-10-CM | POA: Diagnosis not present

## 2023-06-18 DIAGNOSIS — M47817 Spondylosis without myelopathy or radiculopathy, lumbosacral region: Secondary | ICD-10-CM | POA: Diagnosis not present

## 2023-06-27 DIAGNOSIS — D044 Carcinoma in situ of skin of scalp and neck: Secondary | ICD-10-CM | POA: Diagnosis not present

## 2023-07-16 DIAGNOSIS — M545 Low back pain, unspecified: Secondary | ICD-10-CM | POA: Diagnosis not present

## 2023-07-16 DIAGNOSIS — M6281 Muscle weakness (generalized): Secondary | ICD-10-CM | POA: Diagnosis not present

## 2023-07-30 DIAGNOSIS — M545 Low back pain, unspecified: Secondary | ICD-10-CM | POA: Diagnosis not present

## 2023-07-30 DIAGNOSIS — M6281 Muscle weakness (generalized): Secondary | ICD-10-CM | POA: Diagnosis not present

## 2023-08-01 DIAGNOSIS — M6281 Muscle weakness (generalized): Secondary | ICD-10-CM | POA: Diagnosis not present

## 2023-08-01 DIAGNOSIS — M545 Low back pain, unspecified: Secondary | ICD-10-CM | POA: Diagnosis not present

## 2023-08-04 DIAGNOSIS — M545 Low back pain, unspecified: Secondary | ICD-10-CM | POA: Diagnosis not present

## 2023-08-04 DIAGNOSIS — M6281 Muscle weakness (generalized): Secondary | ICD-10-CM | POA: Diagnosis not present

## 2023-08-07 DIAGNOSIS — M6281 Muscle weakness (generalized): Secondary | ICD-10-CM | POA: Diagnosis not present

## 2023-08-07 DIAGNOSIS — M545 Low back pain, unspecified: Secondary | ICD-10-CM | POA: Diagnosis not present

## 2023-08-11 DIAGNOSIS — D044 Carcinoma in situ of skin of scalp and neck: Secondary | ICD-10-CM | POA: Diagnosis not present

## 2023-08-11 DIAGNOSIS — L570 Actinic keratosis: Secondary | ICD-10-CM | POA: Diagnosis not present

## 2023-08-12 DIAGNOSIS — M6281 Muscle weakness (generalized): Secondary | ICD-10-CM | POA: Diagnosis not present

## 2023-08-12 DIAGNOSIS — M545 Low back pain, unspecified: Secondary | ICD-10-CM | POA: Diagnosis not present

## 2023-08-13 DIAGNOSIS — M19041 Primary osteoarthritis, right hand: Secondary | ICD-10-CM | POA: Diagnosis not present

## 2023-08-13 DIAGNOSIS — M545 Low back pain, unspecified: Secondary | ICD-10-CM | POA: Diagnosis not present

## 2023-08-13 DIAGNOSIS — M19042 Primary osteoarthritis, left hand: Secondary | ICD-10-CM | POA: Diagnosis not present

## 2023-08-14 DIAGNOSIS — M545 Low back pain, unspecified: Secondary | ICD-10-CM | POA: Diagnosis not present

## 2023-08-14 DIAGNOSIS — M6281 Muscle weakness (generalized): Secondary | ICD-10-CM | POA: Diagnosis not present

## 2023-08-20 DIAGNOSIS — M6281 Muscle weakness (generalized): Secondary | ICD-10-CM | POA: Diagnosis not present

## 2023-08-20 DIAGNOSIS — M545 Low back pain, unspecified: Secondary | ICD-10-CM | POA: Diagnosis not present

## 2023-08-22 DIAGNOSIS — M545 Low back pain, unspecified: Secondary | ICD-10-CM | POA: Diagnosis not present

## 2023-08-22 DIAGNOSIS — M6281 Muscle weakness (generalized): Secondary | ICD-10-CM | POA: Diagnosis not present

## 2023-08-26 DIAGNOSIS — M6281 Muscle weakness (generalized): Secondary | ICD-10-CM | POA: Diagnosis not present

## 2023-08-26 DIAGNOSIS — M545 Low back pain, unspecified: Secondary | ICD-10-CM | POA: Diagnosis not present

## 2023-08-29 DIAGNOSIS — M545 Low back pain, unspecified: Secondary | ICD-10-CM | POA: Diagnosis not present

## 2023-08-29 DIAGNOSIS — M6281 Muscle weakness (generalized): Secondary | ICD-10-CM | POA: Diagnosis not present

## 2023-09-03 DIAGNOSIS — M6281 Muscle weakness (generalized): Secondary | ICD-10-CM | POA: Diagnosis not present

## 2023-09-03 DIAGNOSIS — M545 Low back pain, unspecified: Secondary | ICD-10-CM | POA: Diagnosis not present

## 2023-09-05 DIAGNOSIS — M545 Low back pain, unspecified: Secondary | ICD-10-CM | POA: Diagnosis not present

## 2023-09-05 DIAGNOSIS — M6281 Muscle weakness (generalized): Secondary | ICD-10-CM | POA: Diagnosis not present

## 2023-09-09 DIAGNOSIS — M545 Low back pain, unspecified: Secondary | ICD-10-CM | POA: Diagnosis not present

## 2023-09-09 DIAGNOSIS — M6281 Muscle weakness (generalized): Secondary | ICD-10-CM | POA: Diagnosis not present

## 2023-09-12 DIAGNOSIS — M545 Low back pain, unspecified: Secondary | ICD-10-CM | POA: Diagnosis not present

## 2023-09-12 DIAGNOSIS — M6281 Muscle weakness (generalized): Secondary | ICD-10-CM | POA: Diagnosis not present

## 2023-10-07 DIAGNOSIS — L820 Inflamed seborrheic keratosis: Secondary | ICD-10-CM | POA: Diagnosis not present

## 2023-10-07 DIAGNOSIS — D044 Carcinoma in situ of skin of scalp and neck: Secondary | ICD-10-CM | POA: Diagnosis not present

## 2023-10-07 DIAGNOSIS — L578 Other skin changes due to chronic exposure to nonionizing radiation: Secondary | ICD-10-CM | POA: Diagnosis not present

## 2023-10-07 DIAGNOSIS — L570 Actinic keratosis: Secondary | ICD-10-CM | POA: Diagnosis not present

## 2023-10-07 DIAGNOSIS — L821 Other seborrheic keratosis: Secondary | ICD-10-CM | POA: Diagnosis not present

## 2023-12-19 DIAGNOSIS — Z79899 Other long term (current) drug therapy: Secondary | ICD-10-CM | POA: Diagnosis not present

## 2023-12-19 DIAGNOSIS — Z125 Encounter for screening for malignant neoplasm of prostate: Secondary | ICD-10-CM | POA: Diagnosis not present

## 2023-12-19 DIAGNOSIS — E7800 Pure hypercholesterolemia, unspecified: Secondary | ICD-10-CM | POA: Diagnosis not present

## 2023-12-19 DIAGNOSIS — R7303 Prediabetes: Secondary | ICD-10-CM | POA: Diagnosis not present

## 2023-12-19 DIAGNOSIS — E559 Vitamin D deficiency, unspecified: Secondary | ICD-10-CM | POA: Diagnosis not present

## 2023-12-19 DIAGNOSIS — Z23 Encounter for immunization: Secondary | ICD-10-CM | POA: Diagnosis not present

## 2023-12-29 DIAGNOSIS — R7303 Prediabetes: Secondary | ICD-10-CM | POA: Diagnosis not present

## 2023-12-29 DIAGNOSIS — M19041 Primary osteoarthritis, right hand: Secondary | ICD-10-CM | POA: Diagnosis not present

## 2023-12-29 DIAGNOSIS — E7800 Pure hypercholesterolemia, unspecified: Secondary | ICD-10-CM | POA: Diagnosis not present

## 2023-12-29 DIAGNOSIS — Z23 Encounter for immunization: Secondary | ICD-10-CM | POA: Diagnosis not present

## 2023-12-29 DIAGNOSIS — M545 Low back pain, unspecified: Secondary | ICD-10-CM | POA: Diagnosis not present

## 2023-12-29 DIAGNOSIS — M19042 Primary osteoarthritis, left hand: Secondary | ICD-10-CM | POA: Diagnosis not present

## 2023-12-29 DIAGNOSIS — E559 Vitamin D deficiency, unspecified: Secondary | ICD-10-CM | POA: Diagnosis not present

## 2023-12-29 DIAGNOSIS — Z9181 History of falling: Secondary | ICD-10-CM | POA: Diagnosis not present

## 2023-12-29 DIAGNOSIS — Z1331 Encounter for screening for depression: Secondary | ICD-10-CM | POA: Diagnosis not present

## 2023-12-29 DIAGNOSIS — Z139 Encounter for screening, unspecified: Secondary | ICD-10-CM | POA: Diagnosis not present
